# Patient Record
Sex: Male | Born: 1970 | Race: Black or African American | Hispanic: No | Marital: Married | Smoking: Never smoker
Health system: Southern US, Community
[De-identification: ages and names within clinical notes are randomized; demographics above are authoritative.]

## PROBLEM LIST (undated history)

## (undated) DIAGNOSIS — K219 Gastro-esophageal reflux disease without esophagitis: Secondary | ICD-10-CM

## (undated) DIAGNOSIS — M948X9 Other specified disorders of cartilage, unspecified sites: Secondary | ICD-10-CM

## (undated) HISTORY — PX: SHOULDER SURGERY: SHX246

---

## 2004-01-26 ENCOUNTER — Emergency Department (HOSPITAL_COMMUNITY): Admission: AD | Admit: 2004-01-26 | Discharge: 2004-01-26 | Payer: Self-pay | Admitting: Family Medicine

## 2004-07-19 ENCOUNTER — Emergency Department (HOSPITAL_COMMUNITY): Admission: EM | Admit: 2004-07-19 | Discharge: 2004-07-19 | Payer: Self-pay | Admitting: Emergency Medicine

## 2009-01-23 ENCOUNTER — Emergency Department (HOSPITAL_COMMUNITY): Admission: EM | Admit: 2009-01-23 | Discharge: 2009-01-23 | Payer: Self-pay | Admitting: Emergency Medicine

## 2011-01-03 ENCOUNTER — Encounter: Payer: Self-pay | Admitting: Internal Medicine

## 2011-03-30 ENCOUNTER — Encounter: Payer: Self-pay | Admitting: Physician Assistant

## 2011-03-30 LAB — POCT I-STAT, CHEM 8
Calcium, Ion: 1.02 mmol/L — ABNORMAL LOW (ref 1.12–1.32)
Creatinine, Ser: 1.1 mg/dL (ref 0.4–1.5)
Glucose, Bld: 94 mg/dL (ref 70–99)
Hemoglobin: 16.7 g/dL (ref 13.0–17.0)
TCO2: 24 mmol/L (ref 0–100)

## 2011-03-30 LAB — POCT CARDIAC MARKERS: CKMB, poc: 1.7 ng/mL (ref 1.0–8.0)

## 2013-08-04 ENCOUNTER — Telehealth (HOSPITAL_COMMUNITY): Payer: Self-pay | Admitting: Emergency Medicine

## 2013-08-04 ENCOUNTER — Encounter (HOSPITAL_COMMUNITY): Payer: Self-pay | Admitting: Emergency Medicine

## 2013-08-04 ENCOUNTER — Emergency Department (HOSPITAL_COMMUNITY)
Admission: EM | Admit: 2013-08-04 | Discharge: 2013-08-04 | Disposition: A | Discharge status: Home / Self Care | Payer: BC Managed Care – PPO | Attending: Emergency Medicine | Admitting: Emergency Medicine

## 2013-08-04 ENCOUNTER — Emergency Department (HOSPITAL_COMMUNITY): Payer: BC Managed Care – PPO

## 2013-08-04 DIAGNOSIS — R109 Unspecified abdominal pain: Secondary | ICD-10-CM | POA: Insufficient documentation

## 2013-08-04 DIAGNOSIS — R350 Frequency of micturition: Secondary | ICD-10-CM | POA: Insufficient documentation

## 2013-08-04 LAB — URINALYSIS, ROUTINE W REFLEX MICROSCOPIC
Ketones, ur: NEGATIVE mg/dL
Nitrite: NEGATIVE
Protein, ur: NEGATIVE mg/dL
Urobilinogen, UA: 0.2 mg/dL (ref 0.0–1.0)

## 2013-08-04 LAB — COMPREHENSIVE METABOLIC PANEL
Alkaline Phosphatase: 82 U/L (ref 39–117)
BUN: 14 mg/dL (ref 6–23)
Chloride: 105 mEq/L (ref 96–112)
GFR calc Af Amer: >90 mL/min (ref >90)
GFR calc non Af Amer: >90 mL/min (ref >90)
Glucose, Bld: 94 mg/dL (ref 70–99)
Potassium: 3.7 mEq/L (ref 3.5–5.1)
Total Bilirubin: 0.5 mg/dL (ref 0.3–1.2)
Total Protein: 7.4 g/dL (ref 6.0–8.3)

## 2013-08-04 LAB — CBC WITH DIFFERENTIAL/PLATELET
Eosinophils Absolute: 0.3 10*3/uL (ref 0.0–0.7)
Hemoglobin: 14.7 g/dL (ref 13.0–17.0)
Lymphs Abs: 2.3 10*3/uL (ref 0.7–4.0)
MCH: 28.7 pg (ref 26.0–34.0)
MCV: 83.6 fL (ref 78.0–100.0)
Monocytes Relative: 10 % (ref 3–12)
Neutrophils Relative %: 60 % (ref 43–77)
RBC: 5.13 MIL/uL (ref 4.22–5.81)

## 2013-08-04 MED ORDER — IOHEXOL 300 MG/ML  SOLN
50.0000 mL | INTRAMUSCULAR | Status: DC | PRN
  Administered 2013-08-04: 50 mL via ORAL

## 2013-08-04 MED ORDER — IOHEXOL 300 MG/ML  SOLN
100.0000 mL | Freq: Once | INTRAMUSCULAR | Status: AC | PRN
  Administered 2013-08-04: 100 mL via INTRAVENOUS

## 2013-08-04 MED ORDER — HYDROCODONE-ACETAMINOPHEN 5-325 MG PO TABS: 2.0000 | ORAL_TABLET | ORAL | Status: DC | PRN

## 2013-08-04 MED ORDER — SODIUM CHLORIDE 0.9 % IV SOLN
INTRAVENOUS | Status: DC
  Administered 2013-08-04: 14:00:00 via INTRAVENOUS

## 2013-08-04 NOTE — ED Provider Notes (Signed)
  CSN: [id number]     Arrival date & time 08/04/13  1334 History     First MD Initiated Contact with Patient 08/04/13 1342     Chief Complaint  Patient presents with  . Abdominal Pain   (Consider location/radiation/quality/duration/timing/severity/associated sxs/prior Treatment) Patient is a 42 y.o. male presenting with abdominal pain. The history is provided by the patient.  Abdominal Pain  patient here complaining of left lower abdominal pain x4 days. No associated fever chills. No vomiting. Some urinary frequency without dysuria or hematuria. Symptoms have been persistent and not associated with food. Patient went to urgent care Center prior to arrival and was sent here for further evaluation. No prior history of same. No treatment used prior to arrival. No prior abdominal surgeries noted. Denies any testicular pain or swelling. Denies any rashes to his flank area.  History reviewed. No pertinent past medical history. Past Surgical History  Procedure Laterality Date  . Shoulder surgery     No family history on file. History  Substance Use Topics  . Smoking status: Never Smoker   . Smokeless tobacco: Not on file  . Alcohol Use: Yes    Review of Systems  Gastrointestinal: Positive for abdominal pain.  All other systems reviewed and are negative.    Allergies  Review of patient's allergies indicates no known allergies.  Home Medications  No current outpatient prescriptions on file. BP 115/76  Pulse 73  Temp(Src) 97.9 F (36.6 C) (Oral)  Resp 18  Ht 5\' 10"  (1.778 m)  Wt 280 lb (127.007 kg)  BMI 40.18 kg/m2  SpO2 96% Physical Exam  Nursing note and vitals reviewed. Constitutional: He is oriented to person, place, and time. He appears well-developed and well-nourished.  Non-toxic appearance. No distress.  HENT:  Head: Normocephalic and atraumatic.  Eyes: Conjunctivae, EOM and lids are normal. Pupils are equal, round, and reactive to light.  Neck: Normal range of  motion. Neck supple. No tracheal deviation present. No mass present.  Cardiovascular: Normal rate, regular rhythm and normal heart sounds.  Exam reveals no gallop.   No murmur heard. Pulmonary/Chest: Effort normal and breath sounds normal. No stridor. No respiratory distress. He has no decreased breath sounds. He has no wheezes. He has no rhonchi. He has no rales.  Abdominal: Soft. Normal appearance and bowel sounds are normal. He exhibits no distension. There is tenderness. There is guarding. There is no rigidity, no rebound and no CVA tenderness.    Musculoskeletal: Normal range of motion. He exhibits no edema and no tenderness.  Neurological: He is alert and oriented to person, place, and time. He has normal strength. No cranial nerve deficit or sensory deficit. GCS eye subscore is 4. GCS verbal subscore is 5. GCS motor subscore is 6.  Skin: Skin is warm and dry. No abrasion and no rash noted.  Psychiatric: He has a normal mood and affect. His speech is normal and behavior is normal.    ED Course   Procedures (including critical care time)  Labs Reviewed  CBC WITH DIFFERENTIAL  COMPREHENSIVE METABOLIC PANEL  URINALYSIS, ROUTINE W REFLEX MICROSCOPIC   No results found. No diagnosis found.  MDM  Patient had a CT scan of his abdomen performed which showed epiploic appendicitis. Patient given pain medication and is stable for discharge at this time  Toy Baker, MD 08/04/13 1712

## 2013-08-04 NOTE — ED Notes (Signed)
Pt from outside urgent care with c/o LLQ abdominal pain with nausea onset Wednesday.

## 2013-11-20 ENCOUNTER — Ambulatory Visit: Payer: BC Managed Care – PPO | Admitting: Licensed Clinical Social Worker

## 2014-03-05 ENCOUNTER — Emergency Department (HOSPITAL_COMMUNITY): Payer: BC Managed Care – PPO

## 2014-03-05 ENCOUNTER — Encounter (HOSPITAL_COMMUNITY): Payer: Self-pay | Admitting: Emergency Medicine

## 2014-03-05 ENCOUNTER — Emergency Department (HOSPITAL_COMMUNITY)
Admission: EM | Admit: 2014-03-05 | Discharge: 2014-03-05 | Disposition: A | Discharge status: Home / Self Care | Payer: BC Managed Care – PPO | Attending: Emergency Medicine | Admitting: Emergency Medicine

## 2014-03-05 DIAGNOSIS — E669 Obesity, unspecified: Secondary | ICD-10-CM | POA: Insufficient documentation

## 2014-03-05 DIAGNOSIS — Z791 Long term (current) use of non-steroidal anti-inflammatories (NSAID): Secondary | ICD-10-CM | POA: Insufficient documentation

## 2014-03-05 DIAGNOSIS — R0789 Other chest pain: Secondary | ICD-10-CM | POA: Insufficient documentation

## 2014-03-05 DIAGNOSIS — R079 Chest pain, unspecified: Secondary | ICD-10-CM

## 2014-03-05 HISTORY — DX: Other specified disorders of cartilage, unspecified sites: M94.8X9

## 2014-03-05 HISTORY — DX: Gastro-esophageal reflux disease without esophagitis: K21.9

## 2014-03-05 LAB — CBC
HCT: 46.4 % (ref 39.0–52.0)
HEMOGLOBIN: 15.5 g/dL (ref 13.0–17.0)
MCH: 28.5 pg (ref 26.0–34.0)
MCHC: 33.4 g/dL (ref 30.0–36.0)
MCV: 85.3 fL (ref 78.0–100.0)
PLATELETS: 278 10*3/uL (ref 150–400)
RBC: 5.44 MIL/uL (ref 4.22–5.81)
RDW: 13.9 % (ref 11.5–15.5)
WBC: 6 10*3/uL (ref 4.0–10.5)

## 2014-03-05 LAB — I-STAT TROPONIN, ED: TROPONIN I, POC: 0 ng/mL (ref 0.00–0.08)

## 2014-03-05 LAB — CK TOTAL AND CKMB (NOT AT ARMC)
CK, MB: 3.3 ng/mL (ref 0.3–4.0)
RELATIVE INDEX: 1.1 (ref 0.0–2.5)
Total CK: 314 U/L — ABNORMAL HIGH (ref 7–232)

## 2014-03-05 LAB — BASIC METABOLIC PANEL
BUN: 13 mg/dL (ref 6–23)
CALCIUM: 9.3 mg/dL (ref 8.4–10.5)
CHLORIDE: 101 meq/L (ref 96–112)
CO2: 25 meq/L (ref 19–32)
Creatinine, Ser: 0.9 mg/dL (ref 0.50–1.35)
GFR calc Af Amer: >90 mL/min (ref >90)
GFR calc non Af Amer: >90 mL/min (ref >90)
Glucose, Bld: 114 mg/dL — ABNORMAL HIGH (ref 70–99)
POTASSIUM: 4 meq/L (ref 3.7–5.3)
SODIUM: 139 meq/L (ref 137–147)

## 2014-03-05 LAB — D-DIMER, QUANTITATIVE (NOT AT ARMC): D DIMER QUANT: 0.29 ug{FEU}/mL (ref 0.00–0.48)

## 2014-03-05 MED ORDER — ASPIRIN 325 MG PO TABS
325.0000 mg | ORAL_TABLET | Freq: Once | ORAL | Status: AC
Start: 2014-03-05 — End: 2014-03-05
  Administered 2014-03-05: 325 mg via ORAL
  Filled 2014-03-05: qty 1

## 2014-03-05 MED ORDER — GI COCKTAIL ~~LOC~~
30.0000 mL | Freq: Once | ORAL | Status: AC
Start: 2014-03-05 — End: 2014-03-05
  Administered 2014-03-05: 30 mL via ORAL
  Filled 2014-03-05: qty 30

## 2014-03-05 NOTE — ED Notes (Signed)
Cardiologist at bedside.  

## 2014-03-05 NOTE — ED Notes (Signed)
Pt undressed, in gown, and now being transported out of the department for a procedure; family at beside

## 2014-03-05 NOTE — ED Notes (Signed)
Pt reports left side chest pain that radiates into his neck, back and left arm, pain started nearly one week ago. Denies any sob or nausea. Was seen at [facility] on Sunday for same. No acute distress noted at triage, ekg done.

## 2014-03-05 NOTE — H&P (Deleted)
Wrong type of note

## 2014-03-05 NOTE — ED Notes (Signed)
Phlebotomy tech and pharmacy tech are at bedside

## 2014-03-05 NOTE — ED Notes (Signed)
To x-ray

## 2014-03-05 NOTE — ED Notes (Signed)
Pt has returned from being out of the department at this time; pt placed on monitor, continuous pulse oximetry and blood pressure cuff; family at bedside

## 2014-03-05 NOTE — ED Provider Notes (Addendum)
CSN: [id number]     Arrival date & time 03/05/14  1143 History   First MD Initiated Contact with Patient 03/05/14 1208     Chief Complaint  Patient presents with  . Chest Pain     (Consider location/radiation/quality/duration/timing/severity/associated sxs/prior Treatment) HPI.... left anterior chest pain described as tightness with radiation to left neck, left arm, left upper back since last Wednesday. Pain is constant and not associated with any activity. Severity is mild to moderate. [facility] [address] [facility] and given hydrocodone, meloxicam and Flexeril.  This has not helped. Nonsmoker. No family history of cardiac disease. Cholesterol normal. He is overweight.  History reviewed. No pertinent past medical history. Past Surgical History  Procedure Laterality Date  . Shoulder surgery     History reviewed. No pertinent family history. History  Substance Use Topics  . Smoking status: Never Smoker   . Smokeless tobacco: Not on file  . Alcohol Use: Yes    Review of Systems  All other systems reviewed and are negative.      Allergies  Review of patient's allergies indicates no known allergies.  Home Medications   Current Outpatient Rx  Name  Route  Sig  Dispense  Refill  . cyclobenzaprine (FLEXERIL) 10 MG tablet   Oral   Take 5-10 mg by mouth daily as needed for muscle spasms.         Marland Kitchen. HYDROcodone-acetaminophen (NORCO/VICODIN) 5-325 MG per tablet   Oral   Take 1 tablet by mouth every 6 (six) hours as needed for moderate pain.         . meloxicam (MOBIC) 15 MG tablet   Oral   Take 15 mg by mouth daily.         . ranitidine (ZANTAC) 150 MG capsule   Oral   Take 150 mg by mouth daily as needed for heartburn.          BP 133/72  Pulse 85  Temp(Src) 98.7 F (37.1 C) (Oral)  Resp 15  SpO2 97% Physical Exam  Nursing note and vitals reviewed. Constitutional: He is oriented to person, place, and time. He appears  well-developed and well-nourished.  obese  HENT:  Head: Normocephalic and atraumatic.  Eyes: Conjunctivae and EOM are normal. Pupils are equal, round, and reactive to light.  Neck: Normal range of motion. Neck supple.  Cardiovascular: Normal rate, regular rhythm and normal heart sounds.   Pulmonary/Chest: Effort normal and breath sounds normal.  Abdominal: Soft. Bowel sounds are normal.  Musculoskeletal: Normal range of motion.  Neurological: He is alert and oriented to person, place, and time.  Skin: Skin is warm and dry.  Psychiatric: He has a normal mood and affect. His behavior is normal.    ED Course  Procedures (including critical care time) Labs Review Labs Reviewed  BASIC METABOLIC PANEL - Abnormal; Notable for the following:    Glucose, Bld 114 (*)    All other components within normal limits  CBC  I-STAT TROPOININ, ED   Imaging Review Dg Chest 2 View  03/05/2014   CLINICAL DATA:  Chest pain  EXAM: CHEST  2 VIEW  COMPARISON:  January 23, 2009  FINDINGS: The heart size and mediastinal contours are within normal limits. There is no focal infiltrate, pulmonary edema, or pleural effusion. The visualized skeletal structures are unremarkable.  IMPRESSION: No active cardiopulmonary disease.   Electronically Signed   By: Sherian ReinWei-Chen  Lin M.D.   On: 03/05/2014 12:16  EKG Interpretation   Date/Time:  Tuesday March 05 2014 11:47:15 EDT Ventricular Rate:  89 PR Interval:  142 QRS Duration: 90 QT Interval:  348 QTC Calculation: 423 R Axis:   140 Text Interpretation:  Normal sinus rhythm Right axis deviation Abnormal  ECG Confirmed by Levar Fayson  MD, Misk Galentine ([id number]) on 03/05/2014 1:54:16 PM      MDM   Final diagnoses:  Chest pain    Patient has minimal risk factor profile, but good history. Aspirin given. Consult cardiology.  [id number]   Cardiology consult obtained.    Cardiologist does not think pain is cardiacin origin.  Patient is hemodynamically stable.  D/C home  Donnetta Hutching,  MD 03/05/14 1426  Donnetta Hutching, MD 03/05/14 [phone]

## 2014-03-05 NOTE — ED Notes (Addendum)
Left neck back and chest pain since last wed some sob  No nausea started cough today painful wjhen he coughs  Denies indigestion

## 2014-03-05 NOTE — Discharge Instructions (Signed)
Chest Pain (Nonspecific) Chest pain has many causes. Your pain could be caused by something serious, such as a heart attack or a blood clot in the lungs. It could also be caused by something less serious, such as a chest bruise or a virus. Follow up with your doctor. More lab tests or other studies may be needed to find the cause of your pain. Most of the time, nonspecific chest pain will improve within 2 to 3 days of rest and mild pain medicine. HOME CARE  For chest bruises, you may put ice on the sore area for 15-20 minutes, 03-04 times a day. Do this only if it makes you feel better.  Put ice in a plastic bag.  Place a towel between the skin and the bag.  Rest for the next 2 to 3 days.  Go back to work if the pain improves.  See your doctor if the pain lasts longer than 1 to 2 weeks.  Only take medicine as told by your doctor.  Quit smoking if you smoke. GET HELP RIGHT AWAY IF:   There is more pain or pain that spreads to the arm, neck, jaw, back, or belly (abdomen).  You have shortness of breath.  You cough more than usual or cough up blood.  You have very bad back or belly pain, feel sick to your stomach (nauseous), or throw up (vomit).  You have very bad weakness.  You pass out (faint).  You have a fever. Any of these problems may be serious and may be an emergency. Do not wait to see if the problems will go away. Get medical help right away. Call your local emergency services 911 in [place] Do not drive yourself to the hospital. MAKE SURE YOU:   Understand these instructions.  Will watch this condition.  Will get help right away if you or your child is not doing well or gets worse. Document Released: 05/17/2008 Document Revised: 02/21/2012 Document Reviewed: 05/17/2008 [facility] Patient Information 2014 [place].   Recommend follow up with cardiologist if symptoms persist

## 2014-03-05 NOTE — Consult Note (Signed)
CARDIOLOGY CONSULT   Patient ID: Jeffery Sanchez  MRN: [id number], DOB/AGE: January 15, 1971 43 y.o.  Date of Encounter: 03/05/2014  Primary Physician: Marlyn Corporal  Primary Cardiologist: New   Chief Complaint:  Chest pain   HPI: Jeffery Sanchez is a 43 y.o. male with no history of CAD. He was evaluated in the emergency room and 2010 for chest pain but left AMA prior to completion of his evaluation. He has never had a cardiac workup.  6 days ago, he had been driving for about an hour and had onset of upper back pain that radiated up into his neck and into his left arm. He was initially a 5 or 6/10. He continued driving for a couple of hours. He did not try any medications for the pain except possibly Tylenol. The pain remained continuous for several days. 2 days later, he developed a left-sided chest pain in addition to the back, neck and arm pain. The chest pain was worse with deep inspiration. The neck pain seems to get a little bit better if he were lying down with his head only up a little bit.   Yesterday, he went to an urgent care because the pain had persisted. He was given a muscle relaxant and an anti-inflammatory. He took these medications as directed but the pain did not improve. In fact, the pain gradually became worse, reaching a 9/10 last p.m. he slept poorly all night long. He noticed some tingling in the first 3 fingers of his left hand including his tongue.   Today, he became more concerned about the symptoms because they were more severe and not resolving so he came to the emergency room. In the emergency room, his pain level is currently a 7/10. The only medication he has received is aspirin. He has never had pain like this before. There is no associated shortness of breath, nausea, vomiting or diaphoresis with the pain. He does not exercise regularly but was able to walk the dog without any change in his symptoms. There is no association with meals. His chest wall is nontender but there is  a tender area on the left side of his neck in the area of C5/6.   Past Medical History   Diagnosis  Date   .  GERD (gastroesophageal reflux disease)    .  Chondritis     Surgical History:  Past Surgical History   Procedure  Laterality  Date   .  Shoulder surgery      I have reviewed the patient's current medications.  Prior to Admission medications   Medication  Sig  Start Date  End Date  Taking?  Authorizing Provider   cyclobenzaprine (FLEXERIL) 10 MG tablet  Take 5-10 mg by mouth daily as needed for muscle spasms.    Yes  Historical Provider, MD   HYDROcodone-acetaminophen (NORCO/VICODIN) 5-325 MG per tablet  Take 1 tablet by mouth every 6 (six) hours as needed for moderate pain.    Yes  Historical Provider, MD   meloxicam (MOBIC) 15 MG tablet  Take 15 mg by mouth daily.    Yes  Historical Provider, MD   ranitidine (ZANTAC) 150 MG capsule  Take 150 mg by mouth daily as needed for heartburn.    Yes  Historical Provider, MD    Scheduled Meds:  .  aspirin  325 mg  Oral  Once    Allergies: No Known Allergies   History    Social History   .  Marital Status:  Married     Spouse Name:  N/A     Number of Children:  N/A   .  Years of Education:  N/A    Occupational History   .  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  Smoking status:  Never Smoker   .  Smokeless tobacco:  Not on file   .  Alcohol Use:  Yes   .  Drug Use:  No   .  Sexual Activity:  Not on file    Social History Narrative    Pt lives with wife and 2 children, ages 40 and 30 (26). No history of coronary disease in any first degree relatives..    Family Status   Relation  Status  Death Age   .  Mother  Alive      Born [phone]. No cardiac problems   .  Father  Alive      Born 1939. No cardiac problems    Review of Systems: Full 14-point review of systems otherwise negative except as noted above.   Physical Exam:  Blood pressure 107/68, pulse 78, temperature 98.7 F  (37.1 C), temperature source Oral, resp. rate 20, SpO2 98.00%.  General: Well developed, well nourished,male in no acute distress.  Head: Normocephalic, atraumatic, sclera non-icteric, no xanthomas, nares are without discharge. Dentition: Good  Neck: No carotid bruits. JVD not elevated. No thyromegally  Lungs: Good expansion bilaterally. without wheezes or rhonchi.  Heart: Regular rate and rhythm with S1 S2. No S3 or S4. No murmur, no rubs, or gallops appreciated.  Abdomen: Soft, non-tender, non-distended with normoactive bowel sounds. No hepatomegaly. No rebound/guarding. No obvious abdominal masses.  Msk: Strength and tone appear normal for age. No joint deformities or effusions, no spine or costo-vertebral angle tenderness.  Extremities: No clubbing or cyanosis. No edema. Distal pedal pulses are 2+ in 4 extrem  Neuro: Alert and oriented X 3. Moves all extremities spontaneously. No focal deficits noted.  Psych: Responds to questions appropriately with a normal affect.  Skin: No rashes or lesions noted   Labs:  Lab Results   Component  Value  Date    WBC  6.0  03/05/2014    HGB  15.5  03/05/2014    HCT  46.4  03/05/2014    MCV  85.3  03/05/2014    PLT  278  03/05/2014     Recent Labs  Lab  03/05/14 1154   NA  139   K  4.0   CL  101   CO2  25   BUN  13   CREATININE  0.90   CALCIUM  9.3   GLUCOSE  114*     Recent Labs   03/05/14 1235   TROPIPOC  0.00    Radiology/Studies: Dg Chest 2 View  03/05/2014 CLINICAL DATA: Chest pain EXAM: CHEST 2 VIEW COMPARISON: January 23, 2009 FINDINGS: The heart size and mediastinal contours are within normal limits. There is no focal infiltrate, pulmonary edema, or pleural effusion. The visualized skeletal structures are unremarkable. IMPRESSION: No active cardiopulmonary disease. Electronically Signed By: Sherian Rein M.D. On: 03/05/2014 12:16   ECG: Sinus rhythm, rate 92, right axis deviation, no acute ischemic changes, no old ECGs in the system  for comparison   ASSESSMENT AND PLAN:  1. chest pain: His symptoms are associated with back pain which started 2 days before the chest pain. He has some risk factors for PE so we'll check  a d-dimer but oxygen saturation is good and he is not significantly tachycardic.   His pain has been continuous for 6 days without acute enzyme changes or ECG changes. We'll check a CK-MB and try a GI cocktail, but the symptoms are not clearly related to GERD.   M.D. advise on further cardiac evaluation.   Melida QuitterSigned,  Rhonda Barrett, PA-C  03/05/2014  2:48 PM  Beeper [phone]  History and all data above reviewed.  Patient examined.  I agree with the findings as above.  The patient has neck and shoulder discomfort with some atypical left arm discomfort that has been constant for several days.  The pain is reproducible with palpation at a point on his left posterior shoulder.  EKG and first POC troponin are negativeThe patient exam reveals COR:RRR, no rub  ,  Lungs: Clear  ,  Abd: Positive bowel sounds, no rebound no guarding, Ext No edema  .  All available labs, radiology testing, previous records reviewed. Agree with documented assessment and plan. Chest pain atypical .  Seems to be musculoskeletal.  No objective evidence of ischemia.  Likelihood that this is cardiac is very low.  D dimer is pending since he has driven back and forth from [place] but I have a low clinical suspicion for PE.  No further cardiac work up.    Jeffery Sanchez  4:21 PM  03/05/2014

## 2014-03-05 NOTE — ED Notes (Signed)
Cardiology in to see pt.

## 2014-03-11 ENCOUNTER — Other Ambulatory Visit: Payer: Self-pay | Admitting: Family Medicine

## 2014-03-11 DIAGNOSIS — M542 Cervicalgia: Secondary | ICD-10-CM

## 2014-03-16 ENCOUNTER — Other Ambulatory Visit: Payer: BC Managed Care – PPO

## 2014-12-04 IMAGING — CT CT ABD-PELV W/ CM
2 of 5 series · 16 of 46 positions shown, 18 images · IV contrast (APPLIED)
Comparison: None

CLINICAL DATA: Right upper quadrant and left lower quadrant pain,
nausea

CT ABDOMEN AND PELVIS WITH CONTRAST
TECHNIQUE: Multidetector CT imaging of the abdomen and pelvis was
performed following the standard protocol during bolus
administration of intravenous contrast. Sagittal and coronal MPR
images reconstructed from axial data set.
Contrast: 100mL OMNIPAQUE IOHEXOL 300 MG/ML  SOLN Dilute oral
contrast.

[Series 2: abd/ pelvis 5.0 i30f 1 · axial · 0.79mm/px · z∈[+997,+1477]mm · 13 of 108 slices shown, 15 images]
[im 6/108  soft-tissue]
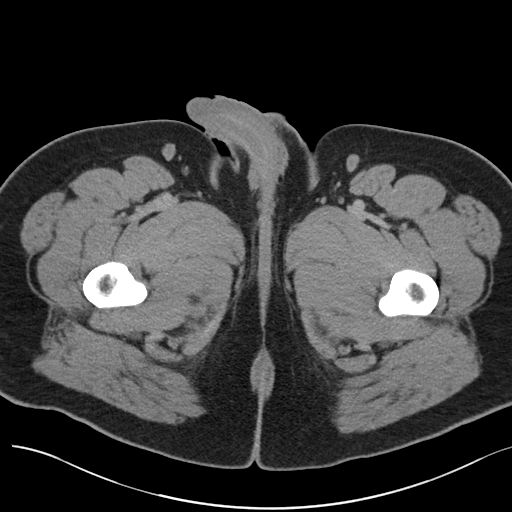
[im 6/108  bone]
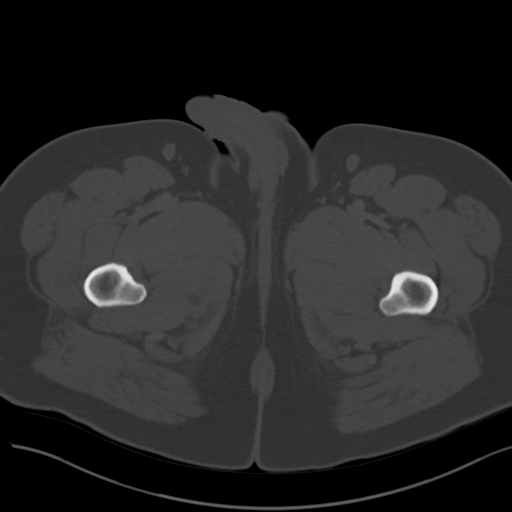
[im 17/108  soft-tissue]
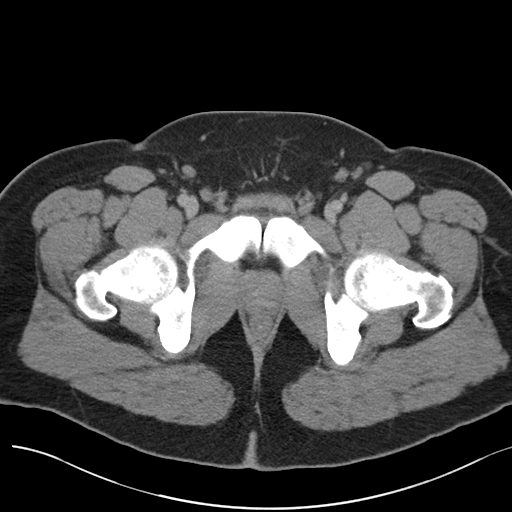
[im 22/108  soft-tissue]
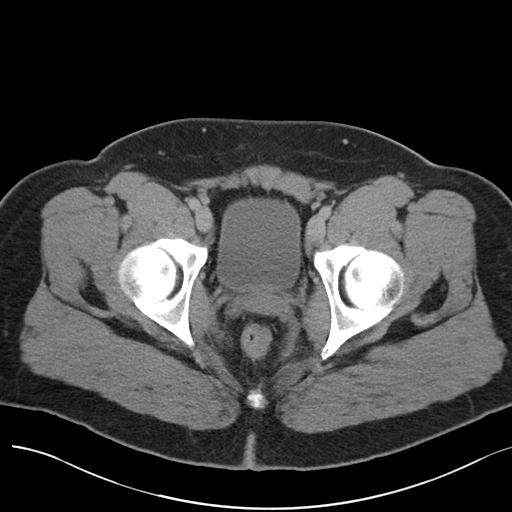
[im 33/108  soft-tissue]
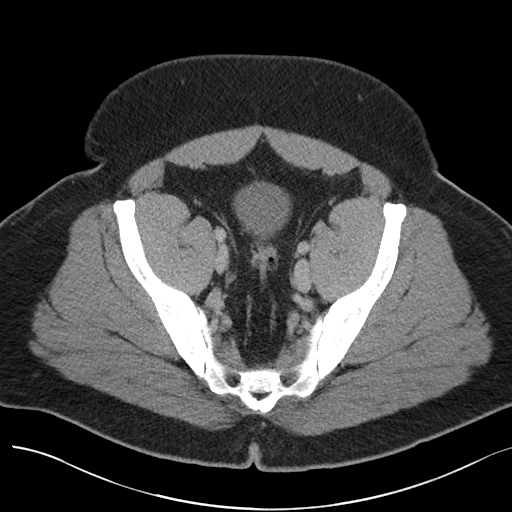
[im 38/108  soft-tissue]
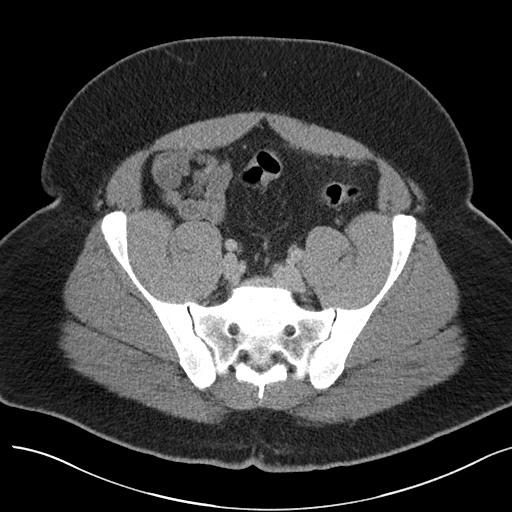
[im 49/108  soft-tissue]
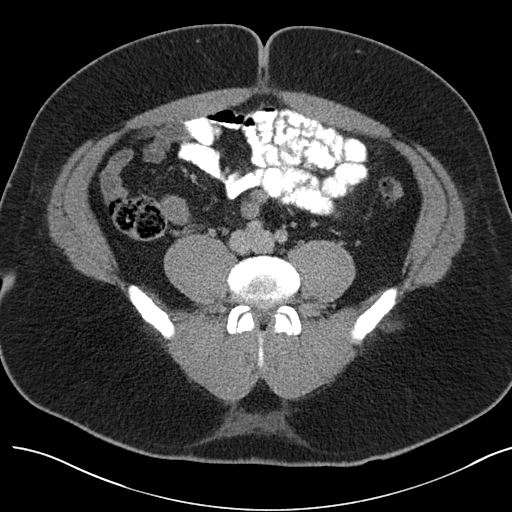
[im 54/108  soft-tissue]
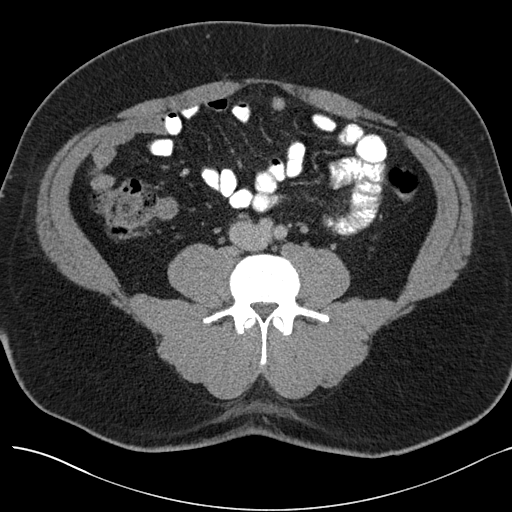
[im 59/108  soft-tissue]
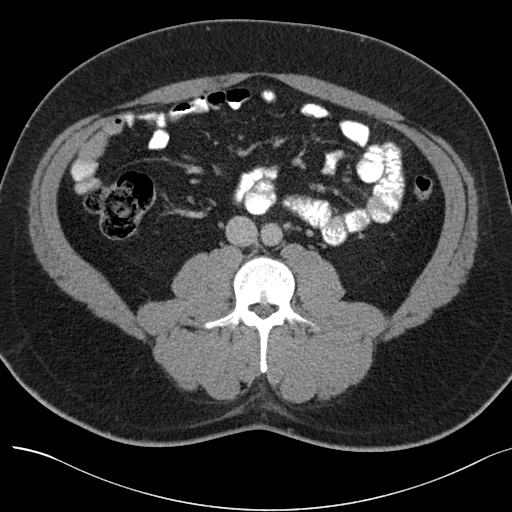
[im 70/108  soft-tissue]
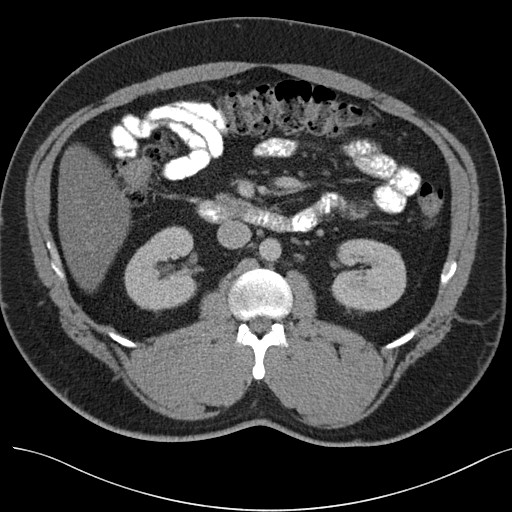
[im 70/108  bone]
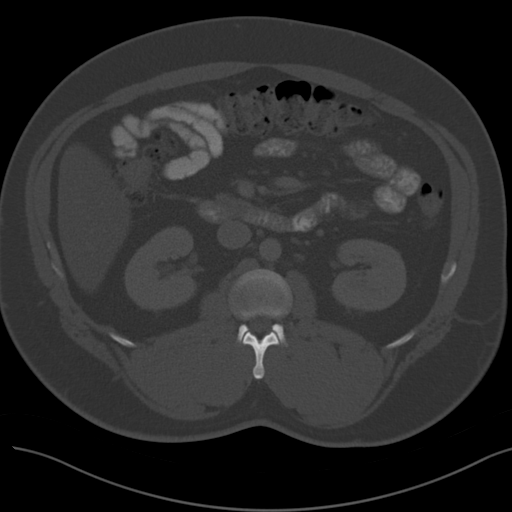
[im 75/108  soft-tissue]
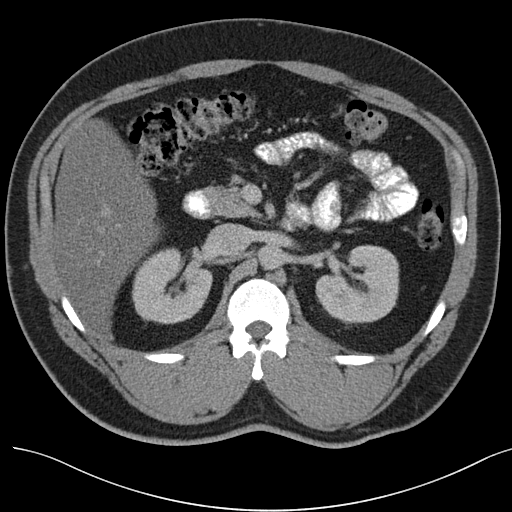
[im 86/108  soft-tissue]
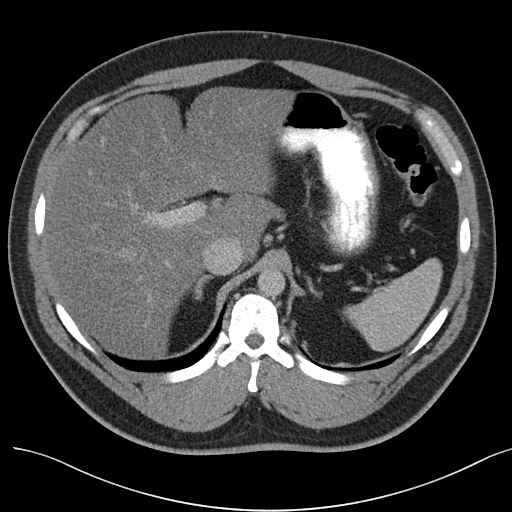
[im 91/108  soft-tissue]
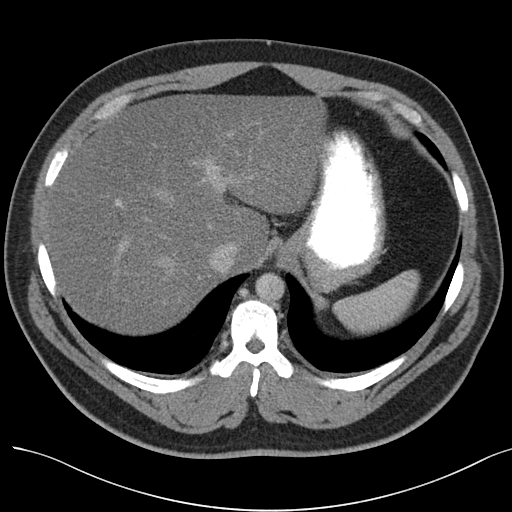
[im 102/108  soft-tissue]
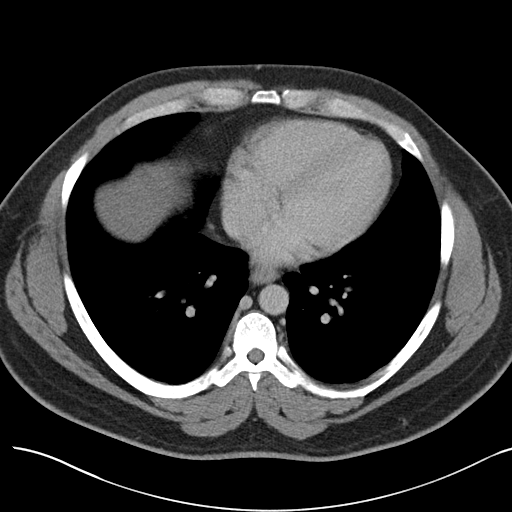

[Series 5: cor · coronal · 0.82mm/px · 3 of 161 slices shown]
[im 54/161  soft-tissue]
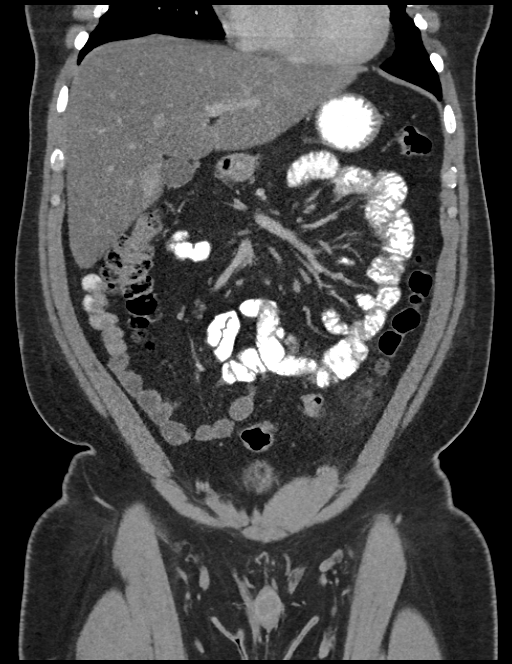
[im 72/161  soft-tissue]
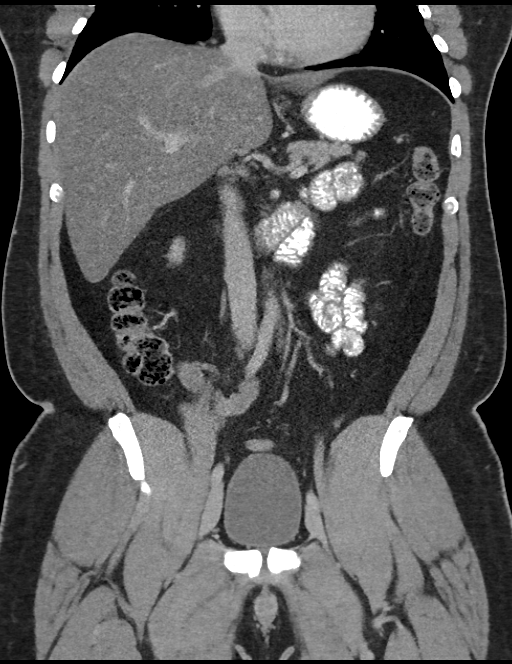
[im 89/161  soft-tissue]
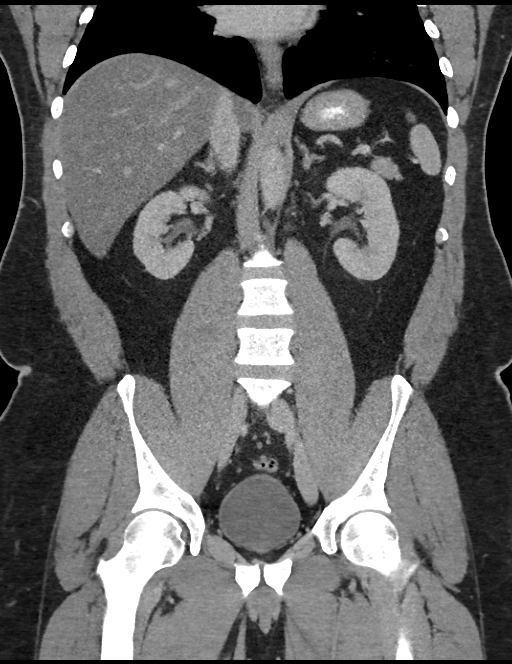

[16 of 46 positions shown; findings below may reference images not displayed]

FINDINGS: Lung bases grossly clear.
Diffuse fatty infiltration of liver with focal sparing adjacent to
gallbladder fossa.
No other abnormalities of the liver, spleen, pancreas, kidneys, or
adrenal glands.
Normal appendix.
Infiltrative changes are identified anterior to the proximal
sigmoid colon, without sigmoid wall thickening or identification of
colonic diverticula.
This most likely represents epiploic appendagitis of the proximal
sigmoid colon.

Stomach and remaining bowel loops normal appearance.
Unremarkable bladder and ureters.
No mass, adenopathy, free fluid, free air, or hernia.
Bones unremarkable.
IMPRESSION: Focal inflammation within fat adjacent to the anterior aspect of
the proximal sigmoid colon most likely representing epiploic
appendagitis.
Diffuse fatty infiltration of liver.
Otherwise negative exam.

## 2019-07-31 ENCOUNTER — Other Ambulatory Visit: Payer: Self-pay

## 2019-07-31 DIAGNOSIS — Z20822 Contact with and (suspected) exposure to covid-19: Secondary | ICD-10-CM

## 2019-08-02 LAB — NOVEL CORONAVIRUS, NAA: SARS-CoV-2, NAA: NOT DETECTED

## 2023-08-15 ENCOUNTER — Emergency Department (HOSPITAL_BASED_OUTPATIENT_CLINIC_OR_DEPARTMENT_OTHER)
Admission: EM | Admit: 2023-08-15 | Discharge: 2023-08-15 | Disposition: A | Discharge status: Home / Self Care | Payer: BC Managed Care – PPO | Attending: Emergency Medicine | Admitting: Emergency Medicine

## 2023-08-15 ENCOUNTER — Emergency Department (HOSPITAL_BASED_OUTPATIENT_CLINIC_OR_DEPARTMENT_OTHER): Payer: BC Managed Care – PPO | Admitting: Radiology

## 2023-08-15 ENCOUNTER — Other Ambulatory Visit: Payer: Self-pay

## 2023-08-15 DIAGNOSIS — I10 Essential (primary) hypertension: Secondary | ICD-10-CM | POA: Diagnosis not present

## 2023-08-15 DIAGNOSIS — R0602 Shortness of breath: Secondary | ICD-10-CM | POA: Diagnosis not present

## 2023-08-15 DIAGNOSIS — Z79899 Other long term (current) drug therapy: Secondary | ICD-10-CM | POA: Diagnosis not present

## 2023-08-15 DIAGNOSIS — R0789 Other chest pain: Secondary | ICD-10-CM

## 2023-08-15 DIAGNOSIS — R079 Chest pain, unspecified: Secondary | ICD-10-CM | POA: Diagnosis present

## 2023-08-15 LAB — CBC
HCT: 43.8 % (ref 39.0–52.0)
Hemoglobin: 14.4 g/dL (ref 13.0–17.0)
MCH: 27.9 pg (ref 26.0–34.0)
MCHC: 32.9 g/dL (ref 30.0–36.0)
MCV: 84.9 fL (ref 80.0–100.0)
Platelets: 267 10*3/uL (ref 150–400)
RBC: 5.16 MIL/uL (ref 4.22–5.81)
RDW: 14.4 % (ref 11.5–15.5)
WBC: 8.5 10*3/uL (ref 4.0–10.5)
nRBC: 0 % (ref 0.0–0.2)

## 2023-08-15 LAB — BASIC METABOLIC PANEL
Anion gap: 9 (ref 5–15)
BUN: 15 mg/dL (ref 6–20)
CO2: 24 mmol/L (ref 22–32)
Calcium: 8.6 mg/dL — ABNORMAL LOW (ref 8.9–10.3)
Chloride: 105 mmol/L (ref 98–111)
Creatinine, Ser: 0.88 mg/dL (ref 0.61–1.24)
GFR, Estimated: >60 mL/min (ref >60)
Glucose, Bld: 102 mg/dL — ABNORMAL HIGH (ref 70–99)
Potassium: 3.7 mmol/L (ref 3.5–5.1)
Sodium: 138 mmol/L (ref 135–145)

## 2023-08-15 LAB — TROPONIN I (HIGH SENSITIVITY): Troponin I (High Sensitivity): 7 ng/L (ref <18)

## 2023-08-15 LAB — BRAIN NATRIURETIC PEPTIDE: B Natriuretic Peptide: 19.7 pg/mL (ref 0.0–100.0)

## 2023-08-15 MED ORDER — AMLODIPINE BESYLATE 2.5 MG PO TABS: 2.5000 mg | ORAL_TABLET | Freq: Every day | ORAL | 0 refills | Status: DC

## 2023-08-15 NOTE — ED Notes (Signed)
ED Provider at bedside. 

## 2023-08-15 NOTE — ED Triage Notes (Signed)
Pt to ED c/o chest pain and SHOB X 6 hours, intermittent in nature, reports when started to not feel well, went and had bp checked and it was 206/87, hx of high bp, noncompliant with medication regimen.

## 2023-08-15 NOTE — ED Provider Notes (Signed)
 [place] EMERGENCY DEPARTMENT AT [facility] Provider Note   CSN: [id number] Arrival date & time: 08/15/23  1721     History Chief Complaint  Patient presents with   Hypertension   Chest Pain   Shortness of Breath    Jeffery Sanchez is a 52 y.o. male.  Patient presents to the emergency department concerns of hypertension.  Reports has also had some shortness of breath for the last 6 hours which he feels is intermittent in nature.  States that he was not feeling well and he had his blood pressure elevated at home.  States that he is on blood pressure medicine at home but is not taking his medications consistently.  Unsure exactly what his blood pressure medication is.  Denies any hemoptysis, leg swelling, recent surgery, or other acute concern.  No recent fevers or sick contacts.   Hypertension Associated symptoms include chest pain and shortness of breath.  Chest Pain Associated symptoms: shortness of breath   Shortness of Breath Associated symptoms: chest pain        Home Medications Prior to Admission medications   Medication Sig Start Date End Date Taking? Authorizing Provider  amLODipine (NORVASC) 2.5 MG tablet Take 1 tablet (2.5 mg total) by mouth daily. 08/16/23   Smitty Knudsen, PA-C  cyclobenzaprine (FLEXERIL) 10 MG tablet Take 5-10 mg by mouth daily as needed for muscle spasms.    [provider]  HYDROcodone-acetaminophen (NORCO/VICODIN) 5-325 MG per tablet Take 1 tablet by mouth every 6 (six) hours as needed for moderate pain.    [provider]  meloxicam (MOBIC) 15 MG tablet Take 15 mg by mouth daily.    [provider]  ranitidine (ZANTAC) 150 MG capsule Take 150 mg by mouth daily as needed for heartburn.    [provider]      Allergies    Patient has no known allergies.    Review of Systems   Review of Systems  Respiratory:  Positive for shortness of breath.   Cardiovascular:  Positive for chest pain.  All other  systems reviewed and are negative.   Physical Exam Updated Vital Signs BP 136/79   Pulse 70   Temp 97.9 F (36.6 C) (Oral)   Resp 17   Ht 5\' 10"  (1.778 m)   Wt (!) 145.2 kg   SpO2 95%   BMI 45.92 kg/m  Physical Exam Vitals and nursing note reviewed.  Constitutional:      General: He is not in acute distress.    Appearance: He is well-developed.  HENT:     Head: Normocephalic and atraumatic.  Eyes:     Conjunctiva/sclera: Conjunctivae normal.  Cardiovascular:     Rate and Rhythm: Normal rate and regular rhythm.     Heart sounds: No murmur heard. Pulmonary:     Effort: Pulmonary effort is normal. No respiratory distress.     Breath sounds: Normal breath sounds. No decreased breath sounds, wheezing, rhonchi or rales.  Abdominal:     Palpations: Abdomen is soft.     Tenderness: There is no abdominal tenderness.  Musculoskeletal:        General: No swelling.     Cervical back: Neck supple.     Right lower leg: No edema.     Left lower leg: No edema.  Skin:    General: Skin is warm and dry.     Capillary Refill: Capillary refill takes less than 2 seconds.  Neurological:     Mental Status:  He is alert.  Psychiatric:        Mood and Affect: Mood normal.     ED Results / Procedures / Treatments   Labs (all labs ordered are listed, but only abnormal results are displayed) Labs Reviewed  BASIC METABOLIC PANEL - Abnormal; Notable for the following components:      Result Value   Glucose, Bld 102 (*)    Calcium 8.6 (*)    All other components within normal limits  CBC  BRAIN NATRIURETIC PEPTIDE  TROPONIN I (HIGH SENSITIVITY)    EKG None  Radiology DG Chest 2 View  Result Date: 08/15/2023 CLINICAL DATA:  cp EXAM: CHEST - 2 VIEW COMPARISON:  Chest x-ray 03/05/2014 FINDINGS: The heart and mediastinal contours are within normal limits. No focal consolidation. No pulmonary edema. No pleural effusion. No pneumothorax. No acute osseous abnormality. IMPRESSION: No active  cardiopulmonary disease. Electronically Signed   By: Tish Frederickson M.D.   On: 08/15/2023 19:09    Procedures Procedures   Medications Ordered in ED Medications - No data to display  ED Course/ Medical Decision Making/ A&P                               Medical Decision Making Amount and/or Complexity of Data Reviewed Labs: ordered. Radiology: ordered.  Risk Prescription drug management.   This patient presents to the ED for concern of hypertension, elevated blood pressure, chest pain.  Differential diagnosis includes ACS, MI, PE, pneumonia, bronchitis   Lab Tests:  I Ordered, and personally interpreted labs.  The pertinent results include: CBC, BMP unremarkable, troponin negative, BNP unremarkable   Imaging Studies ordered:  I ordered imaging studies including chest x-ray I independently visualized and interpreted imaging which showed no acute cardiopulmonary process I agree with the radiologist interpretation   Problem List / ED Course:  Patient presents to the emergency department concerns of chest pain, hypertension.  States that this has been ongoing for several hours today and on discussion of his symptoms, he does not report that he has exactly shortness of breath but instead feels that his chest pain is sometimes worsened with exertion.  No prior history of any cardiac abnormalities except for hypertension.  Does report that his blood pressure was elevated earlier today and states he has been taking his blood pressure medications infrequently.  Unsure exactly what his blood pressure medication is at home. Basic labs including CBC, BMP are unremarkable without any acute findings noted.  Troponin is negative.  BNP is also negative with no indication of fluid overload.  Chest x-ray is unremarkable without any acute cardiopulmonary findings noted.  Patient's EKG is also unremarkable and showing normal sinus rhythm. Discussed reassuring findings with patient and encourage  patient to follow-up with primary care provider in the outpatient setting.  Given the patient is unsure what medication he is taking at home or if he has any medication available, low-dose amlodipine to the patient's pharmacy.  Encourage patient to follow-up with primary care provider for further evaluation and management of blood pressure.  Also discussed strict return precautions such as hemoptysis, syncope, severe shortness of breath, worsening chest pain.  Patient agreeable with this plan with no acute concerns at this time.  Patient discharged home in good condition.  Refill of amlodipine to the patient's pharmacy.  All questions answered prior to patient discharge.  Final Clinical Impression(s) / ED Diagnoses Final diagnoses:  Hypertension, unspecified type  Other chest pain    Rx / DC Orders ED Discharge Orders          Ordered    amLODipine (NORVASC) 2.5 MG tablet  Daily        08/16/23 0016    amLODipine (NORVASC) 2.5 MG tablet  Daily,   Status:  Discontinued        08/15/23 2325              Smitty Knudsen, PA-C 08/16/23 0039    Rondel Baton, MD 08/16/23 1226

## 2023-08-15 NOTE — Discharge Instructions (Addendum)
You were seen in the ER today for concerns of chest pain, elevated blood pressure, and shortness of breath. Your labs were thankfully reassuring today with no acute findings to explain your symptoms noted. I would advise following up with your primary care provider or returning to the ER if symptoms are worsening.

## 2023-08-16 MED ORDER — AMLODIPINE BESYLATE 2.5 MG PO TABS: 2.5000 mg | ORAL_TABLET | Freq: Every day | ORAL | 0 refills | Status: AC
